# Patient Record
Sex: Male | Born: 1995 | Race: White | Hispanic: No | Marital: Single | Smoking: Current some day smoker
Health system: Southern US, Community
[De-identification: ages and names within clinical notes are randomized; demographics above are authoritative.]

## PROBLEM LIST (undated history)

## (undated) DIAGNOSIS — F988 Other specified behavioral and emotional disorders with onset usually occurring in childhood and adolescence: Secondary | ICD-10-CM

## (undated) DIAGNOSIS — F32A Depression, unspecified: Secondary | ICD-10-CM

## (undated) DIAGNOSIS — F329 Major depressive disorder, single episode, unspecified: Secondary | ICD-10-CM

## (undated) DIAGNOSIS — R51 Headache: Secondary | ICD-10-CM

## (undated) DIAGNOSIS — S060X9A Concussion with loss of consciousness of unspecified duration, initial encounter: Secondary | ICD-10-CM

## (undated) HISTORY — PX: OTHER SURGICAL HISTORY: SHX169

## (undated) HISTORY — DX: Headache: R51

## (undated) HISTORY — DX: Concussion with loss of consciousness of unspecified duration, initial encounter: S06.0X9A

## (undated) HISTORY — DX: Other specified behavioral and emotional disorders with onset usually occurring in childhood and adolescence: F98.8

---
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
## 2017-02-18 ENCOUNTER — Ambulatory Visit: Payer: 59 | Admitting: Family Medicine

## 2017-03-10 ENCOUNTER — Encounter: Payer: Self-pay | Admitting: Family Medicine

## 2017-03-10 ENCOUNTER — Ambulatory Visit (INDEPENDENT_AMBULATORY_CARE_PROVIDER_SITE_OTHER): Payer: 59 | Admitting: Family Medicine

## 2017-03-10 VITALS — BP 104/64 | HR 87 | Temp 97.7°F | Ht 69.75 in | Wt 142.0 lb

## 2017-03-10 DIAGNOSIS — R519 Headache, unspecified: Secondary | ICD-10-CM | POA: Insufficient documentation

## 2017-03-10 DIAGNOSIS — F17200 Nicotine dependence, unspecified, uncomplicated: Secondary | ICD-10-CM | POA: Diagnosis not present

## 2017-03-10 DIAGNOSIS — R51 Headache: Secondary | ICD-10-CM

## 2017-03-10 DIAGNOSIS — R7989 Other specified abnormal findings of blood chemistry: Secondary | ICD-10-CM

## 2017-03-10 DIAGNOSIS — F419 Anxiety disorder, unspecified: Secondary | ICD-10-CM | POA: Diagnosis not present

## 2017-03-10 DIAGNOSIS — R945 Abnormal results of liver function studies: Secondary | ICD-10-CM

## 2017-03-10 DIAGNOSIS — K921 Melena: Secondary | ICD-10-CM | POA: Diagnosis not present

## 2017-03-10 DIAGNOSIS — F988 Other specified behavioral and emotional disorders with onset usually occurring in childhood and adolescence: Secondary | ICD-10-CM | POA: Insufficient documentation

## 2017-03-10 DIAGNOSIS — F319 Bipolar disorder, unspecified: Secondary | ICD-10-CM | POA: Diagnosis not present

## 2017-03-10 LAB — CBC WITH DIFFERENTIAL/PLATELET
Basophils Absolute: 0 10*3/uL (ref 0.0–0.1)
Basophils Relative: 0.4 % (ref 0.0–3.0)
EOS PCT: 2.5 % (ref 0.0–5.0)
Eosinophils Absolute: 0.1 10*3/uL (ref 0.0–0.7)
HEMATOCRIT: 47.6 % (ref 36.0–49.0)
Hemoglobin: 16.6 g/dL — ABNORMAL HIGH (ref 12.0–16.0)
Lymphocytes Relative: 28.3 % (ref 24.0–48.0)
Lymphs Abs: 1.3 10*3/uL (ref 0.7–4.0)
MCHC: 34.9 g/dL (ref 31.0–37.0)
MCV: 88.6 fl (ref 78.0–98.0)
MONOS PCT: 11.8 % (ref 3.0–12.0)
Monocytes Absolute: 0.5 10*3/uL (ref 0.1–1.0)
Neutro Abs: 2.6 10*3/uL (ref 1.4–7.7)
Neutrophils Relative %: 57 % (ref 43.0–71.0)
Platelets: 182 10*3/uL (ref 150.0–575.0)
RBC: 5.37 Mil/uL (ref 3.80–5.70)
RDW: 13.3 % (ref 11.4–15.5)
WBC: 4.5 10*3/uL (ref 4.5–13.5)

## 2017-03-10 LAB — COMPREHENSIVE METABOLIC PANEL
ALBUMIN: 4.9 g/dL (ref 3.5–5.2)
ALK PHOS: 79 U/L (ref 52–171)
ALT: 14 U/L (ref 0–53)
AST: 17 U/L (ref 0–37)
BUN: 11 mg/dL (ref 6–23)
CO2: 29 mEq/L (ref 19–32)
Calcium: 9.8 mg/dL (ref 8.4–10.5)
Chloride: 106 mEq/L (ref 96–112)
Creatinine, Ser: 0.9 mg/dL (ref 0.40–1.50)
GFR: 115.83 mL/min (ref >60.00)
Glucose, Bld: 83 mg/dL (ref 70–99)
POTASSIUM: 4.2 meq/L (ref 3.5–5.1)
Sodium: 142 mEq/L (ref 135–145)
TOTAL PROTEIN: 7.1 g/dL (ref 6.0–8.3)
Total Bilirubin: 0.6 mg/dL (ref 0.3–1.2)

## 2017-03-10 LAB — TSH: TSH: 0.83 u[IU]/mL (ref 0.40–5.00)

## 2017-03-10 NOTE — Progress Notes (Signed)
Phone: [phone]  Subjective:  Patient presents today to establish care.  Prior patient of [place] [facility]. Chief complaint-noted.   See problem oriented charting  The following were reviewed and entered/updated in epic: Past Medical History:  Diagnosis Date  . ADD (attention deficit disorder)    no rx now- manages himself. medicine stopped in 6th grade. diagnosed in 1st or 2nd grade he thinks  . Concussion 08/22/2012  . Depression   . Headache(784.0)    after concussion at age 47. perhaps twice a month. ibuprofen helps   Patient Active Problem List   Diagnosis Date Noted  . Smoker 03/10/2017    Priority: High  . Bipolar I disorder (HCC) 11/20/2013    Priority: High  . ODD (oppositional defiant disorder) 09/07/2013    Priority: High  . Cannabis abuse 11/21/2013    Priority: Medium  . Headache     Priority: Low  . ADD (attention deficit disorder)     Priority: Low  . Elevated LFTs 08/19/2016    Priority: Low  . Black stool 08/19/2016    Priority: Low  . ADHD, hyperactive-impulsive type 11/21/2013    Priority: Low  . Concussion without loss of consciousness 08/25/2012    Priority: Low   Past Surgical History:  Procedure Laterality Date  . none      Family History  Problem Relation Age of Onset  . ADD / ADHD Mother   . Depression Mother   . Asthma Mother   . Anxiety disorder Father   . Depression Father   . Hyperlipidemia Father   . Heart attack Paternal Grandfather   . Asthma Sister   . Cancer Maternal Grandmother   . Hearing loss Maternal Grandmother   . Varicose Veins Maternal Grandfather   . Anxiety disorder Maternal Grandfather   . Heart disease Paternal Grandmother   . Hypertension Paternal Grandmother   . Stroke Paternal Grandmother   . Bipolar disorder Paternal Aunt   . Sudden death Neg Hx   . Diabetes Neg Hx   . Alcohol abuse Neg Hx   . Arthritis Neg Hx     Medications- reviewed and updated Current Outpatient Prescriptions    Medication Sig Dispense Refill  . ibuprofen (ADVIL,MOTRIN) 200 MG tablet Take 2 tablets (400 mg total) by mouth every 6 (six) hours as needed for headache. Patient may resume home supply.     No current facility-administered medications for this visit.     Allergies-reviewed and updated No Known Allergies  Social History   Social History  . Marital status: Single    Spouse name: N/A  . Number of children: N/A  . Years of education: N/A   Social History Main Topics  . Smoking status: Current Some Day Smoker    Years: 1.00    Types: E-cigarettes, Cigarettes  . Smokeless tobacco: Never Used     Comment: form given 08/19/16  . Alcohol use No  . Drug use: Yes    Frequency: 2.0 times per week    Types: Marijuana     Comment: Pt told RN he does not use ETOH or drugs/ defiant  . Sexual activity: Yes    Birth control/ protection: Condom   Other Topics Concern  . None   Social History Narrative   Single. Lives with mom and dad and 3 sisters.       Finishing HS this year- senior through [employer]. Did go to [place].    Couldn't stay out of trouble when in school per  him   Makes wings for [employer]   Was a little behind- thinking about- wants to move up through current work. Possibly college      Hobbies: time with his puppy, not a lot of friends because he made poor choices     ROS--Full ROS was completed Review of Systems  Constitutional: Negative for chills and fever.  HENT: Negative for hearing loss and tinnitus.   Eyes: Negative for blurred vision.  Respiratory: Positive for cough (with smoking at times). Negative for hemoptysis.   Cardiovascular: Negative for chest pain and palpitations.  Gastrointestinal: Negative for abdominal pain, blood in stool, heartburn, melena (not since last september when seen by GI) and nausea.  Genitourinary: Negative for dysuria and urgency.  Musculoskeletal: Negative for myalgias and neck pain.  Skin: Negative for itching  and rash.  Neurological: Positive for headaches (about every 2 weeks since concussion 2014). Negative for dizziness.  Endo/Heme/Allergies: Does not bruise/bleed easily.  Psychiatric/Behavioral: Positive for depression and substance abuse. Negative for hallucinations and suicidal ideas. The patient is nervous/anxious and has insomnia.    Objective: BP 104/64 (BP Location: Left Arm, Patient Position: Sitting, Cuff Size: Normal)   Pulse 87   Temp 97.7 F (36.5 C) (Oral)   Ht 5' 9.75" (1.772 m)   Wt 142 lb (64.4 kg)   SpO2 98%   BMI 20.52 kg/m  Gen: NAD, resting comfortably HEENT: Mucous membranes are moist. Oropharynx normal. TM normal. Eyes: sclera and lids normal, PERRLA Neck: no thyromegaly, no cervical lymphadenopathy CV: RRR no murmurs rubs or gallops Lungs: CTAB no crackles, wheeze, rhonchi Abdomen: soft/nontender/nondistended/normal bowel sounds. No rebound or guarding.  Ext: no edema, multiple tatoos, no track marks Skin: warm, dry Neuro: 5/5 strength in upper and lower extremities, normal gait, normal reflexes  Assessment/Plan:  Black stool S: melena last year. Saw GI and had endoscopy which did not show clear cause. Melena resolved without intervention. To monitor only A/P: declines further melena- will let us know if recurs  Bipolar I disorder (HCC) S: records from Dr. Marlyne Beards psychiatry 3 years ago mention bipolar I, ODD, ADHD. PHQ9 of 7 today but only 1 for feeling down and none for SI. States anxiety is bigger issue for him.  A/P: reviewed records and last psychiatry visit was 2015 and was seeing Dr. Lucianne Muss. Then had visit about a year ago in ED for ODD. Discussed with patient would love to help him medication wise but extensive counseling done that this would be best under psychiatry with bipolar history and risks- he was given a list and understands list and return precautions  Smoker  Pack per week. Strongly encouraged cessation from this and marijuana- he is not  prepared to quit. He feels like this helsp with anxiety- once again psychiatry work up would probably be helpful  Get labs to make sure no organic cause. PRN follow up- main issue is psychiatric Orders Placed This Encounter  Procedures  . CBC with Differential/Platelet  . Comprehensive metabolic panel    [place]  . TSH    [place]   Return precautions advised. Tana Conch, MD

## 2017-03-10 NOTE — Patient Instructions (Signed)
Please call one of the psychiatrists listed. I think if we can get your bipolar/anxiety issues in better position it will help you with school and work  Have your mom bring back the designated party release form tomorrow  Strongly encourage you to quit smoking. Getting help with the bipolar will likely help with this

## 2017-03-10 NOTE — Assessment & Plan Note (Signed)
Pack per week. Strongly encouraged cessation from this and marijuana- he is not prepared to quit. He feels like this helsp with anxiety- once again psychiatry work up would probably be helpful

## 2017-03-10 NOTE — Assessment & Plan Note (Signed)
S: melena last year. Saw GI and had endoscopy which did not show clear cause. Melena resolved without intervention. To monitor only A/P: declines further melena- will let us know if recurs

## 2017-03-10 NOTE — Progress Notes (Signed)
Pre visit review using our clinic review tool, if applicable. No additional management support is needed unless otherwise documented below in the visit note. 

## 2017-03-10 NOTE — Assessment & Plan Note (Signed)
S: records from Dr. Marlyne Beards psychiatry 3 years ago mention bipolar I, ODD, ADHD. PHQ9 of 7 today but only 1 for feeling down and none for SI. States anxiety is bigger issue for him.  A/P: reviewed records and last psychiatry visit was 2015 and was seeing Dr. Lucianne Muss. Then had visit about a year ago in ED for ODD. Discussed with patient would love to help him medication wise but extensive counseling done that this would be best under psychiatry with bipolar history and risks- he was given a list and understands list and return precautions

## 2017-06-27 IMAGING — US US ABDOMEN COMPLETE
1 series · 14 of 25 positions shown · non-contrast
Comparison: None.

CLINICAL DATA: Elevated LFTs

EXAM:
ABDOMEN ULTRASOUND COMPLETE

[Series 1: us abdomen complete · 0.19mm/px · 14 of 100 slices shown]
[im 1/100]
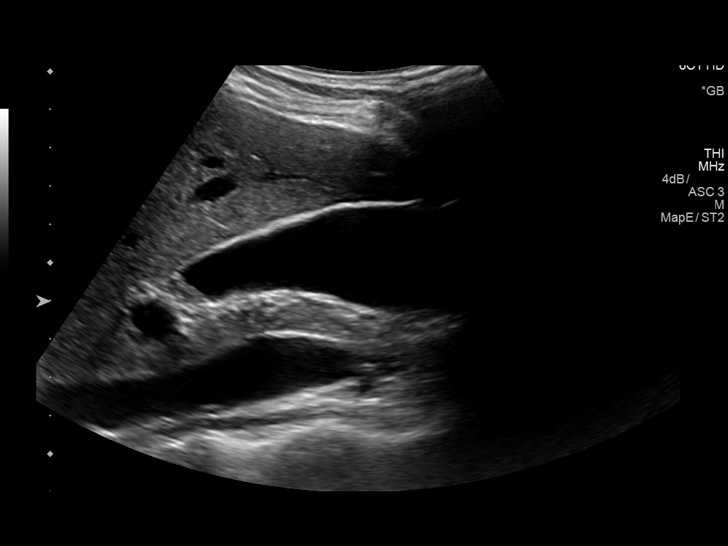
[im 9/100]
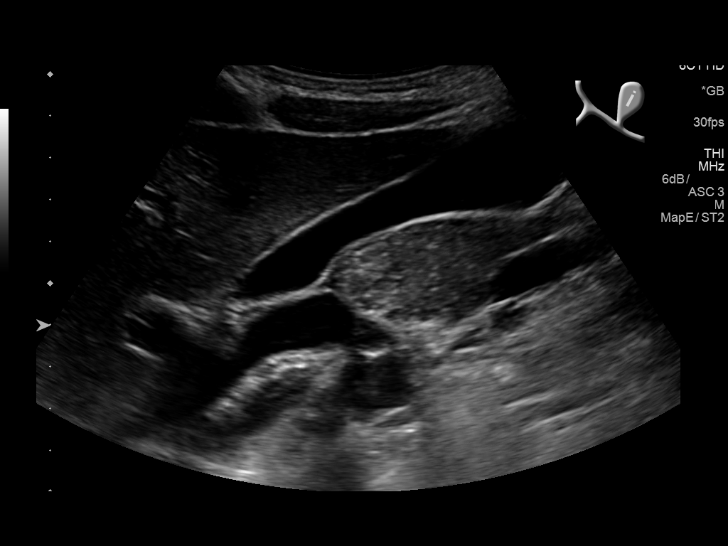
[im 17/100]
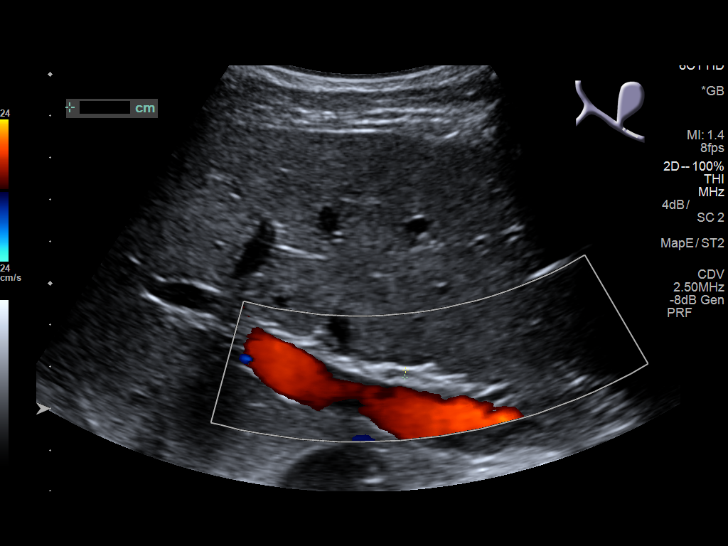
[im 25/100]
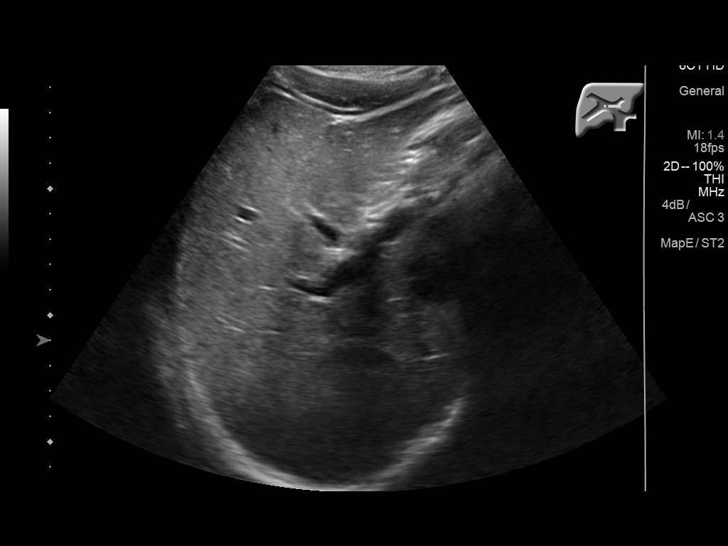
[im 34/100]
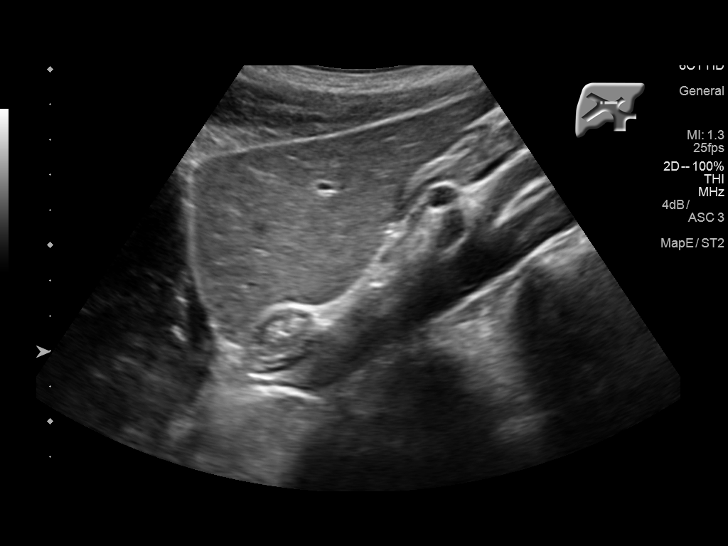
[im 38/100]
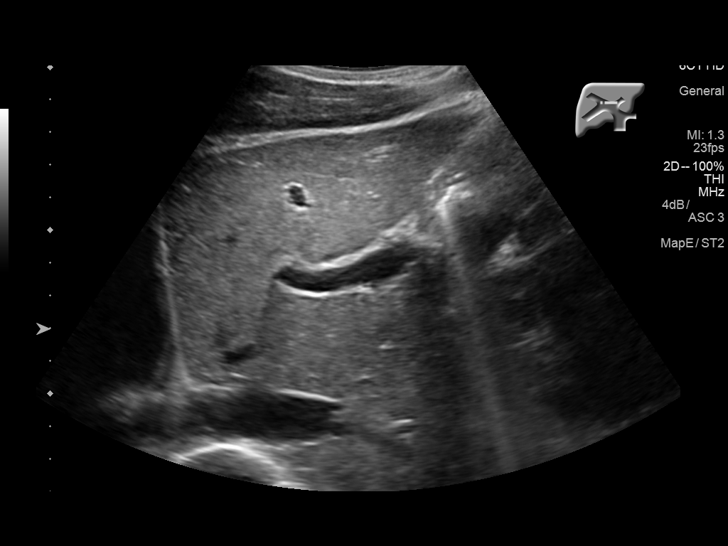
[im 46/100]
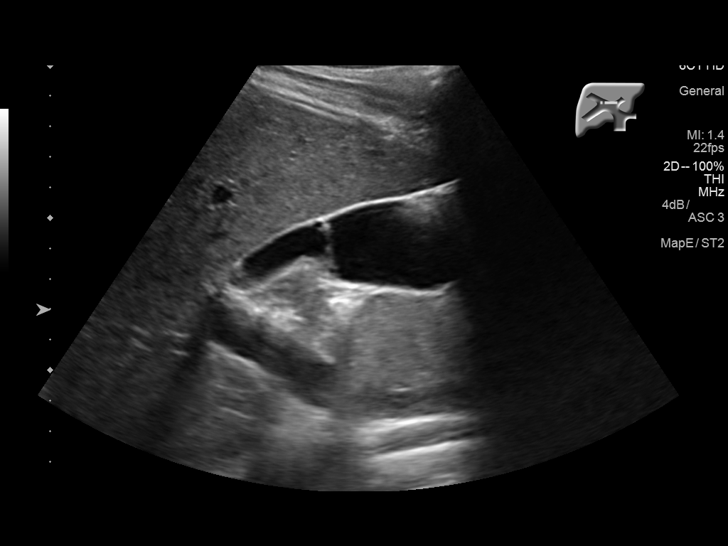
[im 54/100]
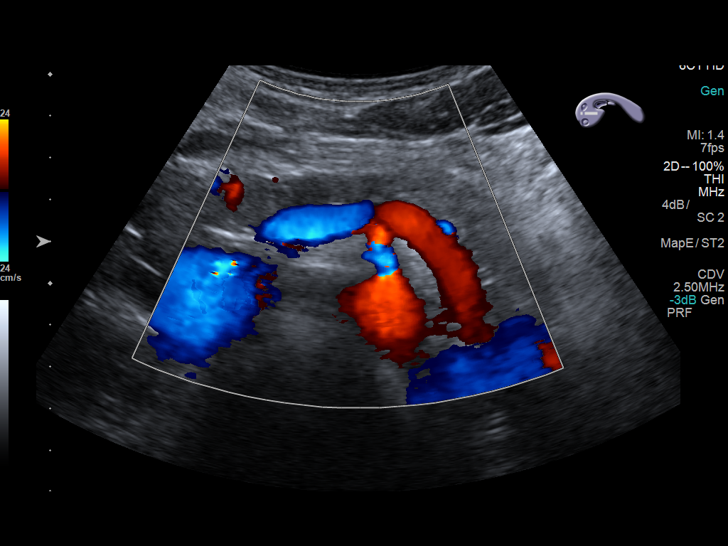
[im 62/100]
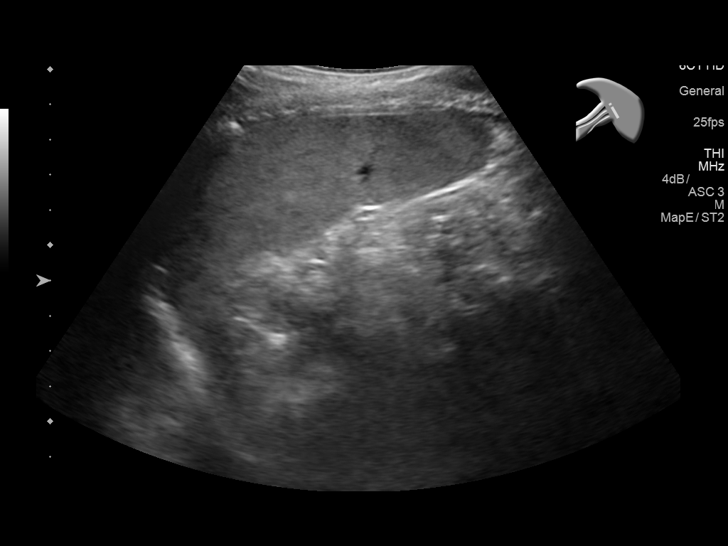
[im 67/100]
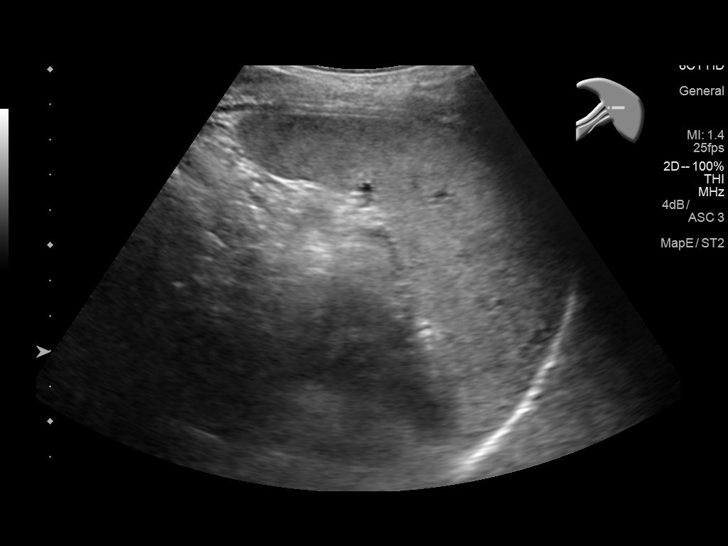
[im 75/100]
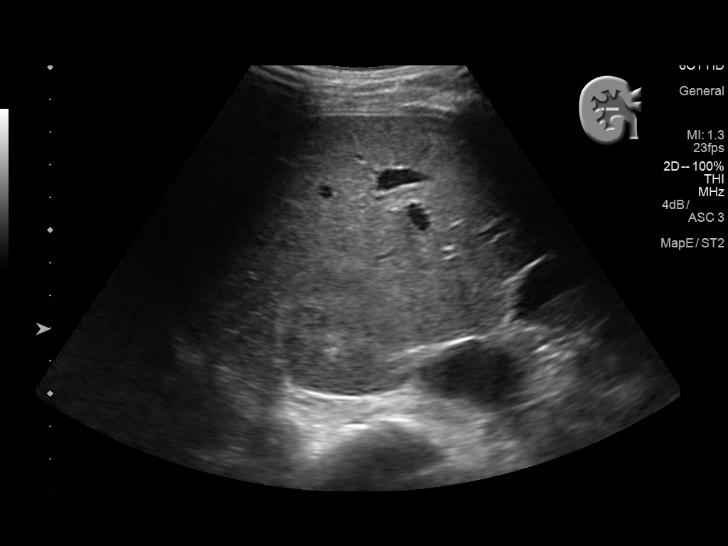
[im 83/100]
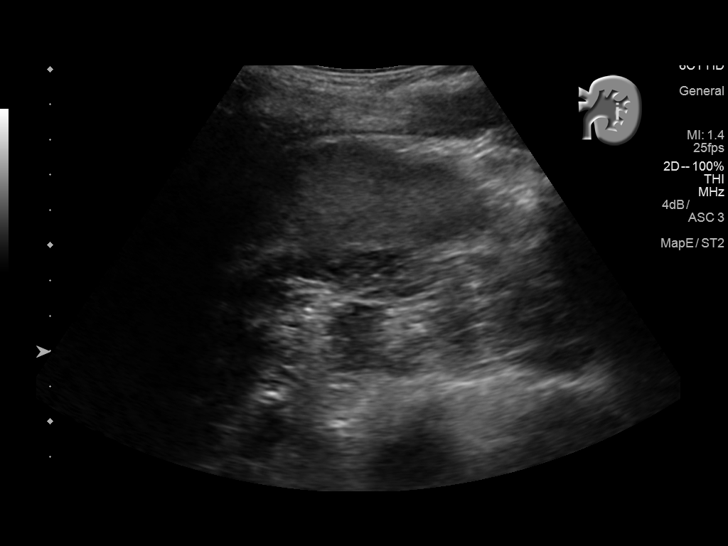
[im 91/100]
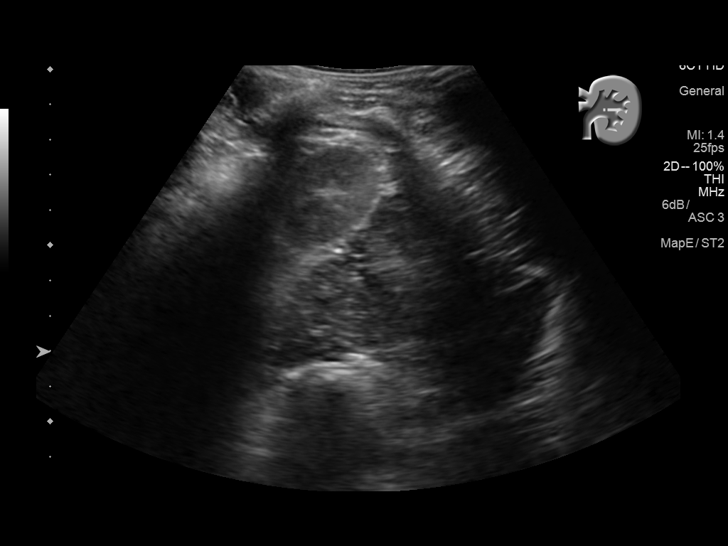
[im 100/100]
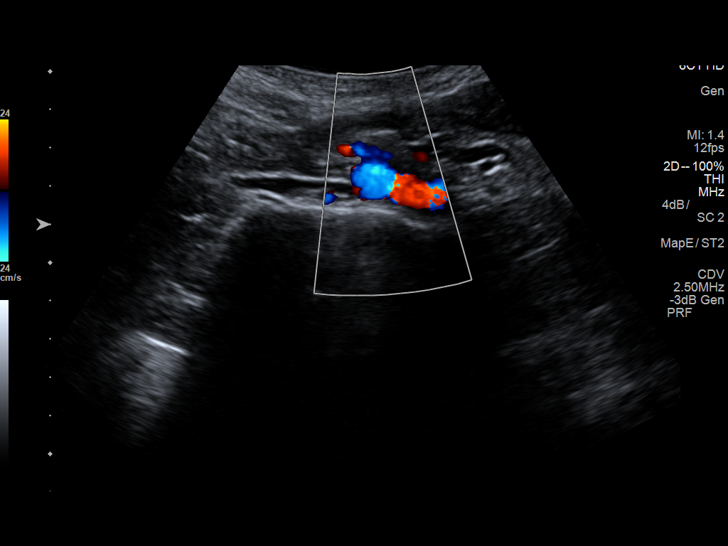

[14 of 25 positions shown; findings below may reference images not displayed]

FINDINGS: Gallbladder: No gallstones or wall thickening visualized. No
sonographic Murphy sign noted by sonographer.

Common bile duct: Diameter: 1.9 mm

Liver: No focal lesion identified. Within normal limits in
parenchymal echogenicity.

IVC: No abnormality visualized.

Pancreas: Visualized portion unremarkable.

Spleen: Size and appearance within normal limits.

Right Kidney: Length: 11 cm.. Echogenicity within normal limits. No
mass or hydronephrosis visualized.

Left Kidney: Length: 11.1 cm.. Echogenicity within normal limits. No
mass or hydronephrosis visualized.

Abdominal aorta: No aneurysm visualized.

Other findings: None.
IMPRESSION: No acute abnormality noted.

## 2017-08-11 ENCOUNTER — Ambulatory Visit (INDEPENDENT_AMBULATORY_CARE_PROVIDER_SITE_OTHER): Payer: BLUE CROSS/BLUE SHIELD | Admitting: Family Medicine

## 2017-08-11 ENCOUNTER — Encounter: Payer: Self-pay | Admitting: Family Medicine

## 2017-08-11 VITALS — BP 108/78 | HR 81 | Temp 98.0°F | Ht 69.8 in | Wt 146.2 lb

## 2017-08-11 DIAGNOSIS — F1210 Cannabis abuse, uncomplicated: Secondary | ICD-10-CM | POA: Diagnosis not present

## 2017-08-11 DIAGNOSIS — F17200 Nicotine dependence, unspecified, uncomplicated: Secondary | ICD-10-CM

## 2017-08-11 DIAGNOSIS — K921 Melena: Secondary | ICD-10-CM | POA: Diagnosis not present

## 2017-08-11 NOTE — Assessment & Plan Note (Signed)
Using vaping instead for now with goal to stop that eventually too

## 2017-08-11 NOTE — Progress Notes (Signed)
Subjective:  Eric Horn is a 21 y.o. year old very pleasant male patient who presents for/with See problem oriented charting ROS- no chest pain, shortness of breath, edema, abnormal fatigue, dizziness or diaphoresis . No bright red blood per rectum.   Past Medical History-  Patient Active Problem List   Diagnosis Date Noted  . Smoker 03/10/2017    Priority: High  . Bipolar I disorder (HCC) 11/20/2013    Priority: High  . ODD (oppositional defiant disorder) 09/07/2013    Priority: High  . Cannabis abuse 11/21/2013    Priority: Medium  . Headache     Priority: Low  . ADD (attention deficit disorder)     Priority: Low  . Elevated LFTs 08/19/2016    Priority: Low  . Black stool 08/19/2016    Priority: Low  . ADHD, hyperactive-impulsive type 11/21/2013    Priority: Low  . Concussion without loss of consciousness 08/25/2012    Priority: Low    Medications- reviewed and updated Current Outpatient Prescriptions  Medication Sig Dispense Refill  . ibuprofen (ADVIL,MOTRIN) 200 MG tablet Take 2 tablets (400 mg total) by mouth every 6 (six) hours as needed for headache. Patient may resume home supply.     No current facility-administered medications for this visit.     Objective: BP 108/78 (BP Location: Left Arm, Patient Position: Sitting, Cuff Size: Large)   Pulse 81   Temp 98 F (36.7 C) (Oral)   Ht 5' 9.8" (1.773 m)   Wt 146 lb 3.2 oz (66.3 kg)   SpO2 97%   BMI 21.10 kg/m  Gen: NAD, resting comfortably CV: RRR no murmurs rubs or gallops Lungs: CTAB no crackles, wheeze, rhonchi Abdomen: soft/nontender/nondistended/normal bowel sounds. No rebound or guarding.  Ext: no edema Skin: warm, dry Neuro: grossly normal, moves all extremities Did not complete rectal exam given no bright red blood per rectum  Assessment/Plan:  Melena - Plan: CBC, Basic metabolic panel, POC Hemoccult Bld/Stl (3-Cd Home Screen), CBC, Basic metabolic panel  Smoker  Cannabis abuse S:  patient saw GI 08/2016 and had endoscopy last year which did not show clear cause of melena. Melena resolved without intervention. At that time stools were heme positive, he had intermittent upper abdominal pain without nsaid use. EGD done to rule out ulcer, erosive gastritis or esophagitis. AST high at that time but normal on April labs here.   Starting two days ago- stools were black x x1. Not sure what he was eating. Has had some intermittent epigastric pain- mild tightness or cramping. Mildest of nausea. No vomiting. No diarrhea or constipation- solid and not painful to defecate  Yesterday and today stools normal A/P: With prior workup- believe reasonable to get stool cards first and if positive- seek GI consult again given only 1 dark stool. Will also get CBC to make sure no significant drop in hemoglobin. He is aware to follow up if recurrence.   Smoker Using vaping instead for now with goal to stop that eventually too  Cannabis abuse Sparing marijuana use in past- has quit since last visit- congratulated him and encouragedhim to stay away from marijuana  Orders Placed This Encounter  Procedures  . CBC    Standing Status:   Future    Number of Occurrences:   1    Standing Expiration Date:   08/11/2018  . Basic metabolic panel    Standing Status:   Future    Number of Occurrences:   1    Standing Expiration  Date:   08/11/2018  . POC Hemoccult Bld/Stl (3-Cd Home Screen)    Send home    Standing Status:   Future    Standing Expiration Date:   08/11/2018   Return precautions advised.  Tana Conch, MD

## 2017-08-11 NOTE — Patient Instructions (Signed)
Please stop by lab before you go- lets make sure your oxygen carrying cells are ok. Also want you to get stool cards to send back to us  If stool cards show no blood and you are not anemic we will just monitor this for recurrence  Great job cutting out marijuana and trying to quit smoking

## 2017-08-11 NOTE — Assessment & Plan Note (Signed)
Sparing marijuana use in past- has quit since last visit- congratulated him and encouragedhim to stay away from marijuana

## 2017-08-12 LAB — CBC
HCT: 49.9 % — ABNORMAL HIGH (ref 36.0–49.0)
Hemoglobin: 17.2 g/dL — ABNORMAL HIGH (ref 12.0–16.0)
MCHC: 34.5 g/dL (ref 31.0–37.0)
MCV: 88.8 fl (ref 78.0–98.0)
Platelets: 216 10*3/uL (ref 150.0–575.0)
RBC: 5.62 Mil/uL (ref 3.80–5.70)
RDW: 13.4 % (ref 11.4–15.5)
WBC: 7.2 10*3/uL (ref 4.5–13.5)

## 2017-08-12 LAB — BASIC METABOLIC PANEL
BUN: 16 mg/dL (ref 6–23)
CHLORIDE: 102 meq/L (ref 96–112)
CO2: 26 meq/L (ref 19–32)
Calcium: 10.5 mg/dL (ref 8.4–10.5)
Creatinine, Ser: 1.06 mg/dL (ref 0.40–1.50)
GFR: 95.47 mL/min (ref >60.00)
GLUCOSE: 84 mg/dL (ref 70–99)
POTASSIUM: 3.9 meq/L (ref 3.5–5.1)
SODIUM: 139 meq/L (ref 135–145)

## 2017-08-16 ENCOUNTER — Other Ambulatory Visit (INDEPENDENT_AMBULATORY_CARE_PROVIDER_SITE_OTHER): Payer: BLUE CROSS/BLUE SHIELD

## 2017-08-16 DIAGNOSIS — K921 Melena: Secondary | ICD-10-CM

## 2017-08-16 LAB — POC HEMOCCULT BLD/STL (HOME/3-CARD/SCREEN)
Card #2 Fecal Occult Blod, POC: NEGATIVE
Card #3 Fecal Occult Blood, POC: NEGATIVE
Fecal Occult Blood, POC: NEGATIVE
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
## 2018-03-22 ENCOUNTER — Telehealth: Payer: Self-pay | Admitting: Family Medicine

## 2018-03-22 NOTE — Telephone Encounter (Signed)
Copied from CRM [phone]. Topic: Quick Communication - See Telephone Encounter >> Mar 22, 2018 12:54 PM Herby AbrahamJohnson, Shiquita C wrote: CRM for notification. See Telephone encounter for: 03/22/18.  Pt's mom Misty StanleyLisa called in to request a call back from CMA in regards to pt   CB: [phone]

## 2018-03-23 NOTE — Telephone Encounter (Signed)
Called and spoke with mother. I am faxing her over a list of psychiatrist as requested.

## 2018-03-28 ENCOUNTER — Ambulatory Visit: Payer: BLUE CROSS/BLUE SHIELD | Admitting: Family Medicine

## 2019-12-28 ENCOUNTER — Other Ambulatory Visit: Payer: BLUE CROSS/BLUE SHIELD

## 2020-07-01 ENCOUNTER — Other Ambulatory Visit: Payer: Self-pay

## 2020-07-01 ENCOUNTER — Encounter (HOSPITAL_COMMUNITY): Payer: Self-pay

## 2020-07-01 ENCOUNTER — Ambulatory Visit (HOSPITAL_COMMUNITY)
Admission: EM | Admit: 2020-07-01 | Discharge: 2020-07-01 | Disposition: A | Discharge status: Home / Self Care | Payer: Self-pay

## 2020-07-01 DIAGNOSIS — Z23 Encounter for immunization: Secondary | ICD-10-CM

## 2020-07-01 DIAGNOSIS — S61212A Laceration without foreign body of right middle finger without damage to nail, initial encounter: Secondary | ICD-10-CM

## 2020-07-01 DIAGNOSIS — M79644 Pain in right finger(s): Secondary | ICD-10-CM

## 2020-07-01 MED ORDER — TETANUS-DIPHTH-ACELL PERTUSSIS 5-2.5-18.5 LF-MCG/0.5 IM SUSP
0.5000 mL | Freq: Once | INTRAMUSCULAR | Status: AC
  Administered 2020-07-01: 12:00:00 0.5 mL via INTRAMUSCULAR

## 2020-07-01 MED ORDER — TETANUS-DIPHTH-ACELL PERTUSSIS 5-2.5-18.5 LF-MCG/0.5 IM SUSP
INTRAMUSCULAR | Status: AC
  Filled 2020-07-01: qty 0.5

## 2020-07-01 MED ORDER — LIDOCAINE HCL 2 % IJ SOLN
INTRAMUSCULAR | Status: AC
  Filled 2020-07-01: qty 20

## 2020-07-01 NOTE — ED Notes (Signed)
Pt awake, alert and oriented, drinking ginger ale and eating crackers upon discharge. Ambulatory in no distress.
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
## 2020-07-01 NOTE — ED Notes (Signed)
Patient states he does want to get the Tetanus shot today
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
## 2020-07-01 NOTE — Discharge Instructions (Signed)

## 2020-12-12 ENCOUNTER — Encounter (HOSPITAL_COMMUNITY): Payer: Self-pay

## 2020-12-12 ENCOUNTER — Ambulatory Visit (HOSPITAL_COMMUNITY)
Admission: EM | Admit: 2020-12-12 | Discharge: 2020-12-12 | Disposition: A | Discharge status: Home / Self Care | Payer: 59 | Attending: Emergency Medicine | Admitting: Emergency Medicine

## 2020-12-12 ENCOUNTER — Other Ambulatory Visit (HOSPITAL_COMMUNITY): Payer: Self-pay | Admitting: Emergency Medicine

## 2020-12-12 ENCOUNTER — Other Ambulatory Visit: Payer: Self-pay

## 2020-12-12 DIAGNOSIS — W540XXA Bitten by dog, initial encounter: Secondary | ICD-10-CM

## 2020-12-12 DIAGNOSIS — S41151A Open bite of right upper arm, initial encounter: Secondary | ICD-10-CM

## 2020-12-12 DIAGNOSIS — S41111A Laceration without foreign body of right upper arm, initial encounter: Secondary | ICD-10-CM

## 2020-12-12 MED ORDER — AMOXICILLIN-POT CLAVULANATE 875-125 MG PO TABS: 1.0000 | ORAL_TABLET | Freq: Two times a day (BID) | ORAL | 0 refills | Status: DC

## 2020-12-12 MED ORDER — LIDOCAINE-EPINEPHRINE 1 %-1:100000 IJ SOLN
INTRAMUSCULAR | Status: AC
  Filled 2020-12-12: qty 1

## 2020-12-12 NOTE — ED Triage Notes (Addendum)
Pt in with c/o dog bite to right forearm that occurred today. Active bleeding  States that it was not his dog that bit him, states his "bone hurts" As far as he knows the dog is up to date on vaccines

## 2020-12-12 NOTE — Discharge Instructions (Signed)
Leave dressing in place until tomorrow. May have small amount of oozing blood still.  Starting tomorrow may cleanse daily with soap and water. Keep covered to keep clean.  Complete course of antibiotics.  Return in 7-10 days for suture removal.  Return for any signs of infection- redness, warmth, swelling, pus drainage
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
## 2020-12-14 DIAGNOSIS — W540XXA Bitten by dog, initial encounter: Secondary | ICD-10-CM | POA: Diagnosis not present

## 2020-12-14 DIAGNOSIS — S41151A Open bite of right upper arm, initial encounter: Secondary | ICD-10-CM | POA: Diagnosis not present

## 2021-02-15 ENCOUNTER — Emergency Department (HOSPITAL_BASED_OUTPATIENT_CLINIC_OR_DEPARTMENT_OTHER)
Admission: EM | Admit: 2021-02-15 | Discharge: 2021-02-15 | Disposition: A | Discharge status: Home / Self Care | Payer: 59 | Attending: Emergency Medicine | Admitting: Emergency Medicine

## 2021-02-15 ENCOUNTER — Other Ambulatory Visit: Payer: Self-pay

## 2021-02-15 DIAGNOSIS — Y92410 Unspecified street and highway as the place of occurrence of the external cause: Secondary | ICD-10-CM | POA: Diagnosis not present

## 2021-02-15 DIAGNOSIS — Z043 Encounter for examination and observation following other accident: Secondary | ICD-10-CM | POA: Diagnosis present

## 2021-02-15 DIAGNOSIS — M791 Myalgia, unspecified site: Secondary | ICD-10-CM | POA: Diagnosis not present

## 2021-02-15 DIAGNOSIS — F17210 Nicotine dependence, cigarettes, uncomplicated: Secondary | ICD-10-CM | POA: Diagnosis not present
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
## 2022-03-11 ENCOUNTER — Other Ambulatory Visit (HOSPITAL_COMMUNITY): Payer: Self-pay

## 2022-03-11 MED ORDER — AMPHETAMINE-DEXTROAMPHET ER 30 MG PO CP24
30.0000 mg | ORAL_CAPSULE | Freq: Two times a day (BID) | ORAL | 0 refills | Status: DC
  Filled 2022-03-11: qty 60, 30d supply, fill #0

## 2022-04-16 ENCOUNTER — Other Ambulatory Visit (HOSPITAL_COMMUNITY): Payer: Self-pay

## 2022-04-16 MED ORDER — AMPHETAMINE-DEXTROAMPHET ER 30 MG PO CP24
30.0000 mg | ORAL_CAPSULE | Freq: Two times a day (BID) | ORAL | 0 refills | Status: AC
  Filled 2022-04-16 (×2): qty 60, 30d supply, fill #0

## 2022-05-27 ENCOUNTER — Other Ambulatory Visit (HOSPITAL_COMMUNITY): Payer: Self-pay

## 2022-05-27 MED ORDER — AMPHETAMINE-DEXTROAMPHET ER 30 MG PO CP24
ORAL_CAPSULE | ORAL | 0 refills | Status: AC
  Filled 2022-05-27: qty 60, 30d supply, fill #0

## 2022-05-27 MED ORDER — AMPHETAMINE-DEXTROAMPHET ER 30 MG PO CP24
ORAL_CAPSULE | ORAL | 0 refills | Status: DC
  Filled 2022-07-05: qty 60, 30d supply, fill #0

## 2022-05-31 ENCOUNTER — Other Ambulatory Visit (HOSPITAL_COMMUNITY): Payer: Self-pay

## 2022-06-11 ENCOUNTER — Other Ambulatory Visit (HOSPITAL_COMMUNITY): Payer: Self-pay

## 2022-06-14 ENCOUNTER — Other Ambulatory Visit (HOSPITAL_COMMUNITY): Payer: Self-pay

## 2022-07-05 ENCOUNTER — Other Ambulatory Visit (HOSPITAL_COMMUNITY): Payer: Self-pay

## 2022-08-10 ENCOUNTER — Other Ambulatory Visit (HOSPITAL_COMMUNITY): Payer: Self-pay

## 2022-08-10 MED ORDER — AMPHETAMINE-DEXTROAMPHET ER 30 MG PO CP24
30.0000 mg | ORAL_CAPSULE | Freq: Two times a day (BID) | ORAL | 0 refills | Status: DC
  Filled 2022-08-10 – 2022-09-13 (×2): qty 60, 30d supply, fill #0

## 2022-08-10 MED ORDER — AMPHETAMINE-DEXTROAMPHET ER 30 MG PO CP24
30.0000 mg | ORAL_CAPSULE | Freq: Two times a day (BID) | ORAL | 0 refills | Status: AC
  Filled 2022-08-10: qty 60, 30d supply, fill #0

## 2022-08-30 ENCOUNTER — Encounter: Payer: Self-pay | Admitting: *Deleted

## 2022-09-13 ENCOUNTER — Other Ambulatory Visit (HOSPITAL_COMMUNITY): Payer: Self-pay

## 2022-09-14 ENCOUNTER — Other Ambulatory Visit (HOSPITAL_COMMUNITY): Payer: Self-pay

## 2022-10-14 ENCOUNTER — Other Ambulatory Visit (HOSPITAL_COMMUNITY): Payer: Self-pay

## 2022-10-15 ENCOUNTER — Other Ambulatory Visit (HOSPITAL_COMMUNITY): Payer: Self-pay

## 2022-10-15 MED ORDER — AMPHETAMINE-DEXTROAMPHET ER 30 MG PO CP24
ORAL_CAPSULE | ORAL | 0 refills | Status: AC
  Filled 2022-10-15: qty 60, 30d supply, fill #0

## 2022-11-16 ENCOUNTER — Other Ambulatory Visit (HOSPITAL_COMMUNITY): Payer: Self-pay

## 2022-11-16 MED ORDER — AMPHETAMINE-DEXTROAMPHET ER 30 MG PO CP24
30.0000 mg | ORAL_CAPSULE | Freq: Two times a day (BID) | ORAL | 0 refills | Status: DC
  Filled 2022-11-16: qty 60, 30d supply, fill #0

## 2022-11-17 ENCOUNTER — Other Ambulatory Visit (HOSPITAL_COMMUNITY): Payer: Self-pay

## 2022-12-21 ENCOUNTER — Other Ambulatory Visit (HOSPITAL_COMMUNITY): Payer: Self-pay

## 2022-12-21 MED ORDER — AMPHETAMINE-DEXTROAMPHET ER 30 MG PO CP24
30.0000 mg | ORAL_CAPSULE | Freq: Two times a day (BID) | ORAL | 0 refills | Status: DC
  Filled 2022-12-21: qty 60, 30d supply, fill #0

## 2023-01-24 ENCOUNTER — Other Ambulatory Visit (HOSPITAL_COMMUNITY): Payer: Self-pay

## 2023-01-24 MED ORDER — AMPHETAMINE-DEXTROAMPHET ER 30 MG PO CP24
ORAL_CAPSULE | ORAL | 0 refills | Status: DC
  Filled 2023-01-24: qty 60, 30d supply, fill #0

## 2023-01-25 ENCOUNTER — Other Ambulatory Visit (HOSPITAL_COMMUNITY): Payer: Self-pay

## 2023-03-02 ENCOUNTER — Other Ambulatory Visit (HOSPITAL_COMMUNITY): Payer: Self-pay

## 2023-03-02 MED ORDER — AMPHETAMINE-DEXTROAMPHET ER 30 MG PO CP24
30.0000 mg | ORAL_CAPSULE | Freq: Two times a day (BID) | ORAL | 0 refills | Status: AC
  Filled 2023-03-02: qty 60, 30d supply, fill #0

## 2023-04-08 ENCOUNTER — Other Ambulatory Visit (HOSPITAL_COMMUNITY): Payer: Self-pay

## 2023-04-08 MED ORDER — AMPHETAMINE-DEXTROAMPHET ER 30 MG PO CP24
30.0000 mg | ORAL_CAPSULE | Freq: Two times a day (BID) | ORAL | 0 refills | Status: AC
  Filled 2023-04-08: qty 60, 30d supply, fill #0

## 2023-04-11 ENCOUNTER — Other Ambulatory Visit (HOSPITAL_COMMUNITY): Payer: Self-pay

## 2023-05-18 ENCOUNTER — Other Ambulatory Visit (HOSPITAL_COMMUNITY): Payer: Self-pay

## 2023-05-18 MED ORDER — AMPHETAMINE-DEXTROAMPHET ER 30 MG PO CP24
30.0000 mg | ORAL_CAPSULE | Freq: Two times a day (BID) | ORAL | 0 refills | Status: AC
  Filled 2023-05-18: qty 60, 30d supply, fill #0

## 2023-06-24 ENCOUNTER — Other Ambulatory Visit (HOSPITAL_COMMUNITY): Payer: Self-pay

## 2023-06-24 DIAGNOSIS — F900 Attention-deficit hyperactivity disorder, predominantly inattentive type: Secondary | ICD-10-CM | POA: Diagnosis not present

## 2023-06-24 MED ORDER — AMPHETAMINE-DEXTROAMPHET ER 30 MG PO CP24
30.0000 mg | ORAL_CAPSULE | Freq: Two times a day (BID) | ORAL | 0 refills | Status: DC
  Filled 2023-06-24: qty 60, 30d supply, fill #0

## 2023-07-25 ENCOUNTER — Other Ambulatory Visit (HOSPITAL_COMMUNITY): Payer: Self-pay

## 2023-07-29 ENCOUNTER — Other Ambulatory Visit (HOSPITAL_COMMUNITY): Payer: Self-pay

## 2023-07-29 MED ORDER — AMPHETAMINE-DEXTROAMPHET ER 30 MG PO CP24
30.0000 mg | ORAL_CAPSULE | Freq: Two times a day (BID) | ORAL | 0 refills | Status: DC
  Filled 2023-07-29: qty 60, 30d supply, fill #0

## 2023-09-01 ENCOUNTER — Other Ambulatory Visit (HOSPITAL_COMMUNITY): Payer: Self-pay

## 2023-09-01 MED ORDER — AMPHETAMINE-DEXTROAMPHET ER 30 MG PO CP24
30.0000 mg | ORAL_CAPSULE | ORAL | 0 refills | Status: AC
  Filled 2023-09-01: qty 60, 30d supply, fill #0

## 2023-09-05 ENCOUNTER — Other Ambulatory Visit (HOSPITAL_COMMUNITY): Payer: Self-pay

## 2023-10-06 ENCOUNTER — Other Ambulatory Visit (HOSPITAL_COMMUNITY): Payer: Self-pay

## 2023-10-07 ENCOUNTER — Other Ambulatory Visit (HOSPITAL_COMMUNITY): Payer: Self-pay

## 2023-10-07 MED ORDER — AMPHETAMINE-DEXTROAMPHET ER 30 MG PO CP24
30.0000 mg | ORAL_CAPSULE | Freq: Two times a day (BID) | ORAL | 0 refills | Status: DC
  Filled 2023-10-07 – 2023-10-10 (×3): qty 60, 30d supply, fill #0

## 2023-10-08 ENCOUNTER — Other Ambulatory Visit (HOSPITAL_COMMUNITY): Payer: Self-pay

## 2023-10-10 ENCOUNTER — Other Ambulatory Visit (HOSPITAL_COMMUNITY): Payer: Self-pay

## 2023-11-10 ENCOUNTER — Other Ambulatory Visit (HOSPITAL_COMMUNITY): Payer: Self-pay

## 2023-11-10 MED ORDER — AMPHETAMINE-DEXTROAMPHET ER 30 MG PO CP24
30.0000 mg | ORAL_CAPSULE | Freq: Two times a day (BID) | ORAL | 0 refills | Status: DC
  Filled 2023-11-10: qty 60, 30d supply, fill #0

## 2023-12-09 ENCOUNTER — Other Ambulatory Visit (HOSPITAL_COMMUNITY): Payer: Self-pay

## 2023-12-09 MED ORDER — AMPHETAMINE-DEXTROAMPHET ER 30 MG PO CP24
30.0000 mg | ORAL_CAPSULE | Freq: Two times a day (BID) | ORAL | 0 refills | Status: DC
  Filled 2023-12-12: qty 60, 30d supply, fill #0

## 2023-12-12 ENCOUNTER — Other Ambulatory Visit: Payer: Self-pay

## 2023-12-12 ENCOUNTER — Other Ambulatory Visit (HOSPITAL_COMMUNITY): Payer: Self-pay

## 2023-12-23 DIAGNOSIS — F900 Attention-deficit hyperactivity disorder, predominantly inattentive type: Secondary | ICD-10-CM | POA: Diagnosis not present

## 2024-01-11 ENCOUNTER — Other Ambulatory Visit (HOSPITAL_COMMUNITY): Payer: Self-pay

## 2024-01-11 MED ORDER — AMPHETAMINE-DEXTROAMPHET ER 30 MG PO CP24
30.0000 mg | ORAL_CAPSULE | Freq: Two times a day (BID) | ORAL | 0 refills | Status: DC
  Filled 2024-01-11: qty 60, 30d supply, fill #0

## 2024-02-14 ENCOUNTER — Other Ambulatory Visit (HOSPITAL_COMMUNITY): Payer: Self-pay

## 2024-02-14 MED ORDER — AMPHETAMINE-DEXTROAMPHET ER 30 MG PO CP24
30.0000 mg | ORAL_CAPSULE | Freq: Two times a day (BID) | ORAL | 0 refills | Status: DC
  Filled 2024-02-14: qty 60, 30d supply, fill #0

## 2024-02-15 ENCOUNTER — Other Ambulatory Visit (HOSPITAL_COMMUNITY): Payer: Self-pay

## 2024-03-16 ENCOUNTER — Other Ambulatory Visit (HOSPITAL_COMMUNITY): Payer: Self-pay

## 2024-03-16 MED ORDER — AMPHETAMINE-DEXTROAMPHET ER 30 MG PO CP24
30.0000 mg | ORAL_CAPSULE | Freq: Two times a day (BID) | ORAL | 0 refills | Status: DC
  Filled 2024-03-16 (×3): qty 60, 30d supply, fill #0

## 2024-04-16 ENCOUNTER — Other Ambulatory Visit (HOSPITAL_COMMUNITY): Payer: Self-pay

## 2024-04-16 MED ORDER — AMPHETAMINE-DEXTROAMPHET ER 30 MG PO CP24
30.0000 mg | ORAL_CAPSULE | Freq: Two times a day (BID) | ORAL | 0 refills | Status: AC
  Filled 2024-04-16: qty 60, 30d supply, fill #0

## 2024-04-16 MED ORDER — AMPHETAMINE-DEXTROAMPHET ER 30 MG PO CP24
30.0000 mg | ORAL_CAPSULE | Freq: Two times a day (BID) | ORAL | 0 refills | Status: DC
  Filled 2024-05-16: qty 60, 30d supply, fill #0

## 2024-04-17 ENCOUNTER — Other Ambulatory Visit (HOSPITAL_COMMUNITY): Payer: Self-pay

## 2024-05-15 ENCOUNTER — Other Ambulatory Visit (HOSPITAL_COMMUNITY): Payer: Self-pay

## 2024-05-16 ENCOUNTER — Other Ambulatory Visit (HOSPITAL_COMMUNITY): Payer: Self-pay

## 2024-06-14 ENCOUNTER — Other Ambulatory Visit (HOSPITAL_COMMUNITY): Payer: Self-pay

## 2024-06-15 ENCOUNTER — Other Ambulatory Visit (HOSPITAL_BASED_OUTPATIENT_CLINIC_OR_DEPARTMENT_OTHER): Payer: Self-pay

## 2024-06-15 ENCOUNTER — Encounter (HOSPITAL_COMMUNITY): Payer: Self-pay

## 2024-06-15 ENCOUNTER — Other Ambulatory Visit: Payer: Self-pay

## 2024-06-15 ENCOUNTER — Other Ambulatory Visit (HOSPITAL_COMMUNITY): Payer: Self-pay

## 2024-06-15 MED ORDER — AMPHETAMINE-DEXTROAMPHET ER 30 MG PO CP24
30.0000 mg | ORAL_CAPSULE | Freq: Two times a day (BID) | ORAL | 0 refills | Status: AC
  Filled 2024-06-15: qty 50, 25d supply, fill #0
  Filled 2024-06-15: qty 60, 30d supply, fill #0
  Filled 2024-06-15: qty 10, 5d supply, fill #0

## 2024-06-19 ENCOUNTER — Other Ambulatory Visit (HOSPITAL_COMMUNITY): Payer: Self-pay

## 2024-06-19 ENCOUNTER — Other Ambulatory Visit (HOSPITAL_BASED_OUTPATIENT_CLINIC_OR_DEPARTMENT_OTHER): Payer: Self-pay

## 2024-06-19 MED ORDER — AMPHETAMINE-DEXTROAMPHET ER 30 MG PO CP24
ORAL_CAPSULE | ORAL | 0 refills | Status: AC
  Filled 2024-08-16: qty 60, 30d supply, fill #0

## 2024-06-19 MED ORDER — AMPHETAMINE-DEXTROAMPHET ER 30 MG PO CP24
ORAL_CAPSULE | ORAL | 0 refills | Status: AC
  Filled 2024-07-16: qty 60, 30d supply, fill #0

## 2024-07-16 ENCOUNTER — Other Ambulatory Visit (HOSPITAL_BASED_OUTPATIENT_CLINIC_OR_DEPARTMENT_OTHER): Payer: Self-pay

## 2024-08-16 ENCOUNTER — Other Ambulatory Visit (HOSPITAL_BASED_OUTPATIENT_CLINIC_OR_DEPARTMENT_OTHER): Payer: Self-pay

## 2024-08-16 ENCOUNTER — Other Ambulatory Visit: Payer: Self-pay

## 2024-09-03 ENCOUNTER — Other Ambulatory Visit: Payer: Self-pay

## 2024-09-17 ENCOUNTER — Other Ambulatory Visit (HOSPITAL_BASED_OUTPATIENT_CLINIC_OR_DEPARTMENT_OTHER): Payer: Self-pay

## 2024-09-17 MED ORDER — AMPHETAMINE-DEXTROAMPHET ER 30 MG PO CP24
30.0000 mg | ORAL_CAPSULE | Freq: Two times a day (BID) | ORAL | 0 refills | Status: AC
  Filled 2024-10-17: qty 60, 30d supply, fill #0

## 2024-09-17 MED ORDER — AMPHETAMINE-DEXTROAMPHET ER 30 MG PO CP24
30.0000 mg | ORAL_CAPSULE | Freq: Two times a day (BID) | ORAL | 0 refills | Status: AC
  Filled 2024-09-17: qty 60, 30d supply, fill #0

## 2024-09-17 MED ORDER — AMPHETAMINE-DEXTROAMPHET ER 30 MG PO CP24
30.0000 mg | ORAL_CAPSULE | Freq: Two times a day (BID) | ORAL | 0 refills | Status: AC
  Filled 2024-11-19: qty 60, 30d supply, fill #0

## 2024-10-16 ENCOUNTER — Other Ambulatory Visit (HOSPITAL_BASED_OUTPATIENT_CLINIC_OR_DEPARTMENT_OTHER): Payer: Self-pay

## 2024-10-17 ENCOUNTER — Other Ambulatory Visit: Payer: Self-pay

## 2024-10-17 ENCOUNTER — Other Ambulatory Visit (HOSPITAL_BASED_OUTPATIENT_CLINIC_OR_DEPARTMENT_OTHER): Payer: Self-pay

## 2024-11-19 ENCOUNTER — Other Ambulatory Visit (HOSPITAL_BASED_OUTPATIENT_CLINIC_OR_DEPARTMENT_OTHER): Payer: Self-pay

## 2024-12-17 ENCOUNTER — Other Ambulatory Visit (HOSPITAL_BASED_OUTPATIENT_CLINIC_OR_DEPARTMENT_OTHER): Payer: Self-pay

## 2024-12-17 MED ORDER — AMPHETAMINE-DEXTROAMPHET ER 30 MG PO CP24: ORAL_CAPSULE | ORAL | 0 refills | Status: AC

## 2024-12-17 MED ORDER — AMPHETAMINE-DEXTROAMPHET ER 30 MG PO CP24
ORAL_CAPSULE | ORAL | 0 refills | Status: AC
  Filled 2024-12-19: qty 60, 30d supply, fill #0

## 2024-12-19 ENCOUNTER — Other Ambulatory Visit (HOSPITAL_BASED_OUTPATIENT_CLINIC_OR_DEPARTMENT_OTHER): Payer: Self-pay
